# Patient Record
Sex: Female | Born: 1990 | Race: Black or African American | Hispanic: No | Marital: Single | Smoking: Never smoker
Health system: Southern US, Community
[De-identification: ages and names within clinical notes are randomized; demographics above are authoritative.]

---
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
## 2010-11-04 ENCOUNTER — Emergency Department (HOSPITAL_COMMUNITY)
Admission: EM | Admit: 2010-11-04 | Discharge: 2010-11-04 | Discharge status: Against Medical Advice | Payer: Self-pay | Attending: Emergency Medicine | Admitting: Emergency Medicine

## 2013-08-19 ENCOUNTER — Encounter (HOSPITAL_COMMUNITY): Payer: Self-pay | Admitting: Emergency Medicine

## 2013-08-19 ENCOUNTER — Emergency Department (HOSPITAL_COMMUNITY)
Admission: EM | Admit: 2013-08-19 | Discharge: 2013-08-19 | Disposition: A | Discharge status: Home / Self Care | Payer: Medicaid Other | Attending: Emergency Medicine | Admitting: Emergency Medicine

## 2013-08-19 DIAGNOSIS — H53149 Visual discomfort, unspecified: Secondary | ICD-10-CM | POA: Insufficient documentation

## 2013-08-19 DIAGNOSIS — H5710 Ocular pain, unspecified eye: Secondary | ICD-10-CM | POA: Insufficient documentation

## 2013-08-19 DIAGNOSIS — H5789 Other specified disorders of eye and adnexa: Secondary | ICD-10-CM | POA: Insufficient documentation

## 2013-08-19 MED ORDER — GENTAMICIN SULFATE 0.3 % OP SOLN: 1.0000 [drp] | OPHTHALMIC | Status: DC

## 2013-08-19 MED ORDER — FLUORESCEIN SODIUM 1 MG OP STRP
1.0000 | ORAL_STRIP | Freq: Once | OPHTHALMIC | Status: DC
  Filled 2013-08-19: qty 1

## 2013-08-19 MED ORDER — TETRACAINE HCL 0.5 % OP SOLN
1.0000 [drp] | Freq: Once | OPHTHALMIC | Status: DC
  Filled 2013-08-19: qty 2

## 2013-08-19 MED ORDER — HYDROCODONE-ACETAMINOPHEN 7.5-325 MG/15ML PO SOLN: 7.5000 mL | Freq: Four times a day (QID) | ORAL | Status: AC | PRN

## 2013-08-19 NOTE — ED Provider Notes (Signed)
CSN: [id number]     Arrival date & time 08/19/13  0803 History   First MD Initiated Contact with Patient 08/19/13 0815     Chief Complaint  Patient presents with  . Eye Pain     (Consider location/radiation/quality/duration/timing/severity/associated sxs/prior Treatment) HPI Comments:    Patient is a 23 y.o. female presenting with eye pain. The history is provided by the patient. No language interpreter was used.  Eye Pain The current episode started in the past 7 days. The problem occurs constantly. The problem has been unchanged. Pertinent negatives include no abdominal pain, anorexia, arthralgias, change in bowel habit, chest pain, chills, congestion, coughing, diaphoresis, fatigue, fever, headaches, joint swelling, myalgias, nausea, neck pain, numbness, rash, sore throat, swollen glands, urinary symptoms, vertigo, visual change, vomiting or weakness. Exacerbated by: light. Treatments tried: cols compress. The treatment provided no relief.    History reviewed. No pertinent past medical history. History reviewed. No pertinent past surgical history. History reviewed. No pertinent family history. History  Substance Use Topics  . Smoking status: Never Smoker   . Smokeless tobacco: Never Used  . Alcohol Use: No   OB History   Grav Para Term Preterm Abortions TAB SAB Ect Mult Living                 Review of Systems  Constitutional: Negative for fever, chills, diaphoresis and fatigue.  HENT: Negative for congestion and sore throat.   Eyes: Positive for photophobia, pain and redness. Negative for discharge, itching and visual disturbance.  Respiratory: Negative for cough.   Cardiovascular: Negative for chest pain.  Gastrointestinal: Negative for nausea, vomiting, abdominal pain, anorexia and change in bowel habit.  Musculoskeletal: Negative for arthralgias, joint swelling, myalgias and neck pain.  Skin: Negative for rash.  Neurological: Negative for vertigo, weakness, numbness and  headaches.      Allergies  Review of patient's allergies indicates not on file.  Home Medications   Prior to Admission medications   Not on File   BP 122/65  Pulse 76  Temp(Src) 97.8 F (36.6 C) (Oral)  Resp 12  Ht 5' 11.5" (1.816 m)  Wt 176 lb (79.833 kg)  BMI 24.21 kg/m2  SpO2 100% Physical Exam  Constitutional: She is oriented to person, place, and time. She appears well-developed and well-nourished. No distress.  HENT:  Head: Normocephalic and atraumatic.  Eyes: EOM are normal. Pupils are equal, round, and reactive to light. Lids are everted and swept, no foreign bodies found. Right eye exhibits no chemosis, no discharge, no exudate and no hordeolum. No foreign body present in the right eye. Left eye exhibits no chemosis, no discharge, no exudate and no hordeolum. No foreign body present in the left eye. Right conjunctiva is injected. Right conjunctiva has no hemorrhage. Left conjunctiva is not injected. Left conjunctiva has no hemorrhage. No scleral icterus. Right eye exhibits normal extraocular motion and no nystagmus. Left eye exhibits normal extraocular motion and no nystagmus.  Slit lamp exam:      The right eye shows no corneal abrasion, no corneal flare, no corneal ulcer, no foreign body and no fluorescein uptake.       The left eye shows no corneal abrasion, no corneal flare, no corneal ulcer and no foreign body.  Patient with right eye injection, no steamy cornea, no pain with indirect light exam.  No pain with extraocular movements.  Fluorescein examination without evidence of corneal abrasion or ulceration.   Unable to assess high pressure as tonopen is nonfunctional.  Neck: Normal range of motion.  Cardiovascular: Normal rate, regular rhythm and normal heart sounds.  Exam reveals no gallop and no friction rub.   No murmur heard. Pulmonary/Chest: Effort normal and breath sounds normal. No respiratory distress.  Abdominal: Soft. Bowel sounds are normal. She exhibits  no distension and no mass. There is no tenderness. There is no guarding.  Neurological: She is alert and oriented to person, place, and time.  Skin: Skin is warm and dry. She is not diaphoretic.    ED Course  Procedures (including critical care time) Labs Review Labs Reviewed - No data to display  Imaging Review No results found.   EKG Interpretation None      MDM   Final diagnoses:  Eye pain    Patient, with red painful eye, photophobia.  No signs of abrasion or infection, no dendritic stains on fluorescein examination, suspect keratitis of the right eye.  Will treat with Garamycin eyedrops, pain medication and ophthalmology followup within 48 hours if not improving.   Arthor Captainbigail Dilon Lank, PA-C 08/20/13 1510

## 2013-08-19 NOTE — Discharge Instructions (Signed)
Leave you contact lenses out of your eye. Use the eye drops as directed. Cold compresses for relief. Read the retun precautions carefully for reasons to come back to the emergency department.  Corneal Ulcer A corneal ulcer is an open sore on the cornea. The cornea is the clear covering at the front and center of the eye.  CAUSES  Most corneal ulcers are caused by infection, but there are other causes as well. Bacterial infection. A bacterial infection can occur and cause a corneal ulcer if: Contact lenses are worn too long (especially overnight) or are not properly cared for. An eye injury occurs, allowing bacteria to infect the area of injury. Viral infection. A viral infection can occur and cause a corneal ulcer if: The eye becomes infected with a virus, such as the herpes simplex (cold sore) virus, chickenpox virus, or shingles virus. Fungal infection. A fungal infection can occur and cause a corneal ulcer if: An eye injury resulted from contact with a plant or plant material. An anti-inflammatory eye drop is overused. You have a weakened immune system. Contact lenses are improperly cared for or become infected. Foreign bodies in the eye, such as sand, glass, or small pieces of glass or metal. Dry eyes. Certain disorders that prevent eyelids from closing completely, such as Bell's palsy. Contact lenses, especially extended-wear soft contact lenses. Contact lenses can: Scratch the cornea's surface, allowing bacteria to enter the scratch. Trap dirt underneath the contact lens, which can scratch the cornea. Harbor bacteria and fungi, making it more likely for bacterial infections to occur. Block oxygen from the cornea, making it more likely for infections to occur. SYMPTOMS  Eye pain that is often severe. Blurry vision. Light sensitivity. Pus or thick discharge coming from your eye. Eye redness. Feeling like something is in your eye. Watery or itchy eye. Burning or stinging  feeling. Some ulcers that are very big may be seen as a white spot on the cornea. DIAGNOSIS  An eye exam will be performed. Your health care provider may use a special kind of microscope (slit lamp) to look at the cornea. Eye drops may be put into the eye to make the ulcer easier to see. If it is suspected that an infection caused the corneal ulcer, tissue samples or cultures from the eye may be taken. Numbing eye drops will be given before any samples or cultures are taken. The samples or cultures will be examined in the lab to check for bacteria, viruses, or fungi. TREATMENT  Treatment of the corneal ulcer depends on the cause. If your ulcer is severe, you may be given antibiotic eye drops up until your health care provider knows the test results. Other treatments can include: Antibacterial, antiviral, or antifungal eye drops or ointment. Removing the foreign body that caused the eye injury. Artificial tears or a bandage contact lens if severe dry eyes caused the corneal ulcer. Over-the-counter or prescription pain medicine. Steroidal eye drops if the eye is inflamed and swollen. Antibiotic medicines by mouth. An injection of medicine under the thin membrane covering the eyeball (conjunctiva). This allows medicine to reach the ulcer in high doses. Eye patching to reduce irritation from blinking and bright light. An eye patch may not be given if the ulcer was caused by a bacterial infection. If the corneal ulcer causes a scar on the cornea that interferes with vision, hospitalization and surgery may be needed to replace the cornea (corneal transplant). HOME CARE INSTRUCTIONS  If prescribed, use your antibiotic pills, eye  drops, or ointment as directed. Continue using them even if you start to feel better. You may have to apply eye drops as often as every few minutes to every hour, for days. It may be necessary to set your alarm clock every few minutes to every hour during the night. This is  absolutely necessary. Only take over-the-counter or prescription medicines as directed by your health care provider. Apply artificial tears as needed if you have dry eyes. Do not touch or rub your eye, because this may increase the irritation and spread the infection. Avoid wearing eye makeup. Stay in a dark room and use sunglasses if you have light sensitivity. Apply cool packs to your eye to relieve discomfort and swelling. If your eye is patched, you should not drive or use machinery. You will have reduced side vision and ability to judge distance. Do not drive or operate machinery until approved by your health care provider. Your ability to judge distances is impaired. Follow up with your health care provider as directed. Do not wear contact lenses until your health care provider approves. If you normally wear contact lenses, follow these general rules to avoid the risk of a corneal ulcer: Do not wear contact lenses while you sleep. Wash your hands before removing contact lenses. Properly sterilize and store your contact lenses. Regularly clean your contact lens case. Do not use your saliva or tap water to clean or wet your contact lenses. Remove your contact lenses if your eye becomes irritated. You may put them back in once your eyes feel better. SEEK IMMEDIATE MEDICAL CARE IF:  You notice a change in your vision. Your pain is getting worse, not better. You have increasing discharge from the eye. MAKE SURE YOU:  Understand these instructions. Will watch your condition. Will get help right away if you are not doing well or get worse. Document Released: 05/21/2004 Document Revised: 12/14/2012 Document Reviewed: 09/13/2012 [facility] Patient Information 2014 [employer], [place].

## 2013-08-19 NOTE — ED Notes (Signed)
Pt reports the pain in RT eye started on Thursday 08-17-13 . Pt does wear contacts and has not used them since. Pt reports eye sensitive to light.

## 2013-09-05 NOTE — ED Provider Notes (Signed)
Medical screening examination/treatment/procedure(s) were performed by non-physician practitioner and as supervising physician I was immediately available for consultation/collaboration.   EKG Interpretation None        Antonisha Waskey, MD 09/05/13 1907 

## 2013-10-20 ENCOUNTER — Emergency Department (HOSPITAL_COMMUNITY)
Admission: EM | Admit: 2013-10-20 | Discharge: 2013-10-20 | Disposition: A | Discharge status: Home / Self Care | Payer: Medicaid Other | Attending: Emergency Medicine | Admitting: Emergency Medicine

## 2013-10-20 ENCOUNTER — Encounter (HOSPITAL_COMMUNITY): Payer: Self-pay | Admitting: Emergency Medicine

## 2013-10-20 DIAGNOSIS — Y929 Unspecified place or not applicable: Secondary | ICD-10-CM | POA: Diagnosis not present

## 2013-10-20 DIAGNOSIS — S058X9A Other injuries of unspecified eye and orbit, initial encounter: Secondary | ICD-10-CM | POA: Insufficient documentation

## 2013-10-20 DIAGNOSIS — H579 Unspecified disorder of eye and adnexa: Secondary | ICD-10-CM | POA: Diagnosis present

## 2013-10-20 DIAGNOSIS — X58XXXA Exposure to other specified factors, initial encounter: Secondary | ICD-10-CM | POA: Insufficient documentation

## 2013-10-20 DIAGNOSIS — Y9389 Activity, other specified: Secondary | ICD-10-CM | POA: Diagnosis not present

## 2013-10-20 DIAGNOSIS — Z23 Encounter for immunization: Secondary | ICD-10-CM | POA: Insufficient documentation

## 2013-10-20 DIAGNOSIS — S0502XA Injury of conjunctiva and corneal abrasion without foreign body, left eye, initial encounter: Secondary | ICD-10-CM

## 2013-10-20 MED ORDER — GENTAMICIN SULFATE 0.3 % OP SOLN: 1.0000 [drp] | Freq: Every day | OPHTHALMIC | Status: DC

## 2013-10-20 MED ORDER — FLUORESCEIN SODIUM 1 MG OP STRP
1.0000 | ORAL_STRIP | Freq: Once | OPHTHALMIC | Status: AC
  Administered 2013-10-20: 1 via OPHTHALMIC
  Filled 2013-10-20: qty 1

## 2013-10-20 MED ORDER — TETANUS-DIPHTH-ACELL PERTUSSIS 5-2.5-18.5 LF-MCG/0.5 IM SUSP
0.5000 mL | Freq: Once | INTRAMUSCULAR | Status: AC
  Administered 2013-10-20: 0.5 mL via INTRAMUSCULAR
  Filled 2013-10-20: qty 0.5

## 2013-10-20 MED ORDER — TETRACAINE HCL 0.5 % OP SOLN
2.0000 [drp] | Freq: Once | OPHTHALMIC | Status: AC
  Administered 2013-10-20: 2 [drp] via OPHTHALMIC
  Filled 2013-10-20: qty 2

## 2013-10-20 NOTE — ED Notes (Addendum)
Lt,. Eye is red, painful, tearing and drainage noted.  Pt. Has rt. Eye a few months ago with the same symptoms.  She is out of her eye drops.  Pt. Reports it feels like something is in her eye.  Photophobia present.  Wearing her sunglasses, decreases the disomfort

## 2013-10-20 NOTE — ED Provider Notes (Signed)
CSN: [id number]     Arrival date & time 10/20/13  1018 History   First MD Initiated Contact with Patient 10/20/13 1114    This chart was scribed for Trixie DredgeEmily West PA-C, a non-physician practitioner working with Donnetta HutchingBrian Cook, MD by Lewanda RifeAlexandra Hurtado, ED Scribe. This patient was seen in room TR05C/TR05C and the patient's care was started at 11:33 AM      Chief Complaint  Patient presents with  . Eye Pain     (Consider location/radiation/quality/duration/timing/severity/associated sxs/prior Treatment) The history is provided by the patient. No language interpreter was used.   HPI Comments: Deborah Bowers is a 23 y.o. female who presents to the Emergency Department complaining of constant moderate left eye pain.  This began 2 days ago. States she wears contacts and works at an [employer] place. Reports associated left eye itchiness, and photophobia. Reports pain is exacerbated by light. States she had similar symptoms in the right eye 2 months ago, was previously evaluated in ED and prescribed gentamycin, with great improvement. Denies trying other any alleviating factors. Denies associated fever, eye discharge, pain with EOM, known foreign body, and trauma. Tetanus is not up to date. She does wear contacts.    History reviewed. No pertinent past medical history. History reviewed. No pertinent past surgical history. No family history on file. History  Substance Use Topics  . Smoking status: Never Smoker   . Smokeless tobacco: Never Used  . Alcohol Use: No   OB History   Grav Para Term Preterm Abortions TAB SAB Ect Mult Living                 Review of Systems  Constitutional: Negative for fever and chills.  Eyes: Positive for photophobia, pain, redness and itching. Negative for discharge and visual disturbance.  Musculoskeletal: Negative for myalgias.  Skin: Negative for rash and wound.  Allergic/Immunologic: Negative for immunocompromised state.  Hematological: Does not bruise/bleed  easily.      Allergies  Review of patient's allergies indicates no known allergies.  Home Medications   Prior to Admission medications   Medication Sig Start Date End Date Taking? Authorizing Provider  gentamicin (GARAMYCIN) 0.3 % ophthalmic solution Place 1 drop into the right eye every 4 (four) hours. 08/19/13   Arthor CaptainAbigail Harris, PA-C  HYDROcodone-acetaminophen (HYCET) 7.5-325 mg/15 ml solution Take 7.5 mLs by mouth 4 (four) times daily as needed for moderate pain. 08/19/13 08/19/14  Arthor CaptainAbigail Harris, PA-C   BP 116/57  Pulse 76  Temp(Src) 99.2 F (37.3 C) (Oral)  Resp 18  Ht 4\' 11"  (1.499 m)  Wt 170 lb (77.111 kg)  BMI 34.32 kg/m2  SpO2 99% Physical Exam  Nursing note and vitals reviewed. Constitutional: She appears well-developed and well-nourished. No distress.  HENT:  Head: Normocephalic and atraumatic.  Eyes: EOM are normal. Pupils are equal, round, and reactive to light. Lids are everted and swept, no foreign bodies found. Left eye exhibits discharge (clear ). Left conjunctiva is injected. No scleral icterus.  Fundoscopic exam:      The left eye shows no exudate.  Slit lamp exam:      The left eye shows corneal abrasion and fluorescein uptake. The left eye shows no foreign body.    Left eyelid is normal.     Visual Acuity  Right Eye Distance: 20/30 Left Eye Distance: 20/200 Bilateral Distance: 20/25  Right Eye Near: R Near: 20/25 Left Eye Near:  L Near: 20/100 Bilateral Near:  20/20   Neck: Neck supple.  Pulmonary/Chest: Effort normal.  Neurological: She is alert.  Skin: She is not diaphoretic.    ED Course  Procedures (including critical care time) COORDINATION OF CARE:  Nursing notes reviewed. Vital signs reviewed. Initial pt interview and examination performed.   Filed Vitals:   10/20/13 1029  BP: 116/57  Pulse: 76  Temp: 99.2 F (37.3 C)  TempSrc: Oral  Resp: 18  Height: 4\' 11"  (1.499 m)  Weight: 170 lb (77.111 kg)  SpO2: 99%    11:33  AM-Discussed work up plan with pt at bedside, which includes  Orders Placed This Encounter  Procedures  . Visual acuity screening (if they wear contacts or glasses - do the screening with those in place if available)    Standing Status: Standing     Number of Occurrences: 1     Standing Expiration Date:   . Pt agrees with plan.   Treatment plan initiated:Medications - No data to display   Initial diagnostic testing ordered.       Labs Review Labs Reviewed - No data to display  Imaging Review No results found.   EKG Interpretation None      MDM   Final diagnoses:  Left corneal abrasion, initial encounter   Afebrile nontoxic patient with left corneal abrasion.  She wears contact lenses - advised to not wear contacts until her eye doctor clears her to do so.  No e/o cellulitis.  No apparent FB on exam.  D/C home with gentamycin drops, pt declined pain medication, ophthalmology follow up.  Discussed findings, treatment, and follow up  with patient.  Pt given return precautions.  Pt verbalizes understanding and agrees with plan.      I personally performed the services described in this documentation, which was scribed in my presence. The recorded information has been reviewed and is accurate.    Trixie Dredgemily West, PA-C 10/20/13 1303

## 2013-10-20 NOTE — Discharge Instructions (Signed)
Read the information below.  Use the prescribed medication as directed.  Please discuss all new medications with your pharmacist.  You may return to the Emergency Department at any time for worsening condition or any new symptoms that concern you.  If you develop uncontrolled pain, change in your vision, large amount of discharge, difficulty moving your eyes, see your eye doctor or return to the ER immediately for a recheck.    Corneal Abrasion The cornea is the clear covering at the front and center of the eye. When looking at the colored portion of the eye (iris), you are looking through the cornea. This very thin tissue is made up of many layers. The surface layer is a single layer of cells (corneal epithelium) and is one of the most sensitive tissues in the body. If a scratch or injury causes the corneal epithelium to come off, it is called a corneal abrasion. If the injury extends to the tissues below the epithelium, the condition is called a corneal ulcer. CAUSES   Scratches.  Trauma.  Foreign body in the eye. Some people have recurrences of abrasions in the area of the original injury even after it has healed (recurrent erosion syndrome). Recurrent erosion syndrome generally improves and goes away with time. SYMPTOMS   Eye pain.  Difficulty or inability to keep the injured eye open.  The eye becomes very sensitive to light.  Recurrent erosions tend to happen suddenly, first thing in the morning, usually after waking up and opening the eye. DIAGNOSIS  Your health care provider can diagnose a corneal abrasion during an eye exam. Dye is usually placed in the eye using a drop or a small paper strip moistened by your tears. When the eye is examined with a special light, the abrasion shows up clearly because of the dye. TREATMENT   Small abrasions may be treated with antibiotic drops or ointment alone.  A pressure patch may be put over the eye. If this is done, follow your doctor's  instructions for when to remove the patch. Do not drive or use machines while the eye patch is on. Judging distances is hard to do with a patch on. If the abrasion becomes infected and spreads to the deeper tissues of the cornea, a corneal ulcer can result. This is serious because it can cause corneal scarring. Corneal scars interfere with light passing through the cornea and cause a loss of vision in the involved eye. HOME CARE INSTRUCTIONS  Use medicine or ointment as directed. Only take over-the-counter or prescription medicines for pain, discomfort, or fever as directed by your health care provider.  Do not drive or operate machinery if your eye is patched. Your ability to judge distances is impaired.  If your health care provider has given you a follow-up appointment, it is very important to keep that appointment. Not keeping the appointment could result in a severe eye infection or permanent loss of vision. If there is any problem keeping the appointment, let your health care provider know. SEEK MEDICAL CARE IF:   You have pain, light sensitivity, and a scratchy feeling in one eye or both eyes.  Your pressure patch keeps loosening up, and you can blink your eye under the patch after treatment.  Any kind of discharge develops from the eye after treatment or if the lids stick together in the morning.  You have the same symptoms in the morning as you did with the original abrasion days, weeks, or months after the abrasion healed.  MAKE SURE YOU:   Understand these instructions.  Will watch your condition.  Will get help right away if you are not doing well or get worse. Document Released: 04/10/2000 Document Revised: 04/18/2013 Document Reviewed: 12/19/2012 [facility] Patient Information 2015 [place], [place]. This information is not intended to replace advice given to you by your health care provider. Make sure you discuss any questions you have with your health care provider.

## 2013-10-20 NOTE — ED Notes (Signed)
Pt states that her contact is her right eye pt states she is unable to put her contact in her left eye due to irritation

## 2013-10-20 NOTE — ED Notes (Signed)
Pt refused wheelchair.  

## 2013-10-29 NOTE — ED Provider Notes (Signed)
Medical screening examination/treatment/procedure(s) were performed by non-physician practitioner and as supervising physician I was immediately available for consultation/collaboration.   EKG Interpretation None       Deborah HutchingBrian Cook, MD 10/29/13 2038

## 2015-03-09 ENCOUNTER — Encounter (HOSPITAL_COMMUNITY): Payer: Self-pay

## 2015-03-09 ENCOUNTER — Emergency Department (HOSPITAL_COMMUNITY)
Admission: EM | Admit: 2015-03-09 | Discharge: 2015-03-09 | Disposition: A | Discharge status: Home / Self Care | Payer: Medicaid Other | Attending: Emergency Medicine | Admitting: Emergency Medicine

## 2015-03-09 DIAGNOSIS — R220 Localized swelling, mass and lump, head: Secondary | ICD-10-CM

## 2015-03-09 DIAGNOSIS — K002 Abnormalities of size and form of teeth: Secondary | ICD-10-CM | POA: Insufficient documentation

## 2015-03-09 DIAGNOSIS — K029 Dental caries, unspecified: Secondary | ICD-10-CM

## 2015-03-09 DIAGNOSIS — K047 Periapical abscess without sinus: Secondary | ICD-10-CM | POA: Insufficient documentation

## 2015-03-09 MED ORDER — PENICILLIN V POTASSIUM 500 MG PO TABS: 500.0000 mg | ORAL_TABLET | Freq: Four times a day (QID) | ORAL | Status: AC

## 2015-03-09 MED ORDER — TRAMADOL HCL 50 MG PO TABS
50.0000 mg | ORAL_TABLET | Freq: Four times a day (QID) | ORAL | Status: AC | PRN
Start: 2015-03-09 — End: ?

## 2015-03-09 MED ORDER — PENICILLIN V POTASSIUM 500 MG PO TABS
500.0000 mg | ORAL_TABLET | Freq: Once | ORAL | Status: AC
  Administered 2015-03-09: 500 mg via ORAL
  Filled 2015-03-09: qty 1

## 2015-03-09 NOTE — ED Provider Notes (Signed)
CSN: [id number]     Arrival date & time 03/09/15  1740 History  By signing my name below, I, Phillis HaggisGabriella Gaje, attest that this documentation has been prepared under the direction and in the presence of Danelle BerryLeisa Brandis Wixted, PA-C. Electronically Signed: Phillis HaggisGabriella Gaje, ED Scribe. 03/09/2015. 8:13 PM.   Chief Complaint  Patient presents with  . Oral Swelling   The history is provided by the patient. No language interpreter was used.  HPI Comments: Deborah Bowers is a 24 y.o. female who presents to the Emergency Department complaining of intermittent, throbbing left lower dental pain with facial swelling onset 1 day ago. Pt reports only brushing the area with her toothbrush and Pronamel toothpaste to some relief over the past 3 weeks, but states that the pain worsened last night. She denies trying anything for her pain last night. She denies anti-biotic treatment for this problem. She states that she has seen a dentist for this problem. She denies fever, chills, nausea, or vomiting.   History reviewed. No pertinent past medical history. No past surgical history on file. No family history on file. Social History  Substance Use Topics  . Smoking status: Never Smoker   . Smokeless tobacco: Never Used  . Alcohol Use: No   OB History    No data available     Review of Systems  Constitutional: Negative for fever and chills.  HENT: Positive for dental problem and facial swelling.   Gastrointestinal: Negative for nausea and vomiting.  All other systems reviewed and are negative.  Allergies  Review of patient's allergies indicates no known allergies.  Home Medications   Prior to Admission medications   Medication Sig Start Date End Date Taking? Authorizing Provider  penicillin v potassium (VEETID) 500 MG tablet Take 1 tablet (500 mg total) by mouth 4 (four) times daily. 03/09/15 03/16/15  Danelle BerryLeisa Deborrah Mabin, PA-C  traMADol (ULTRAM) 50 MG tablet Take 1 tablet (50 mg total) by mouth every 6 (six) hours  as needed. 03/09/15   Danelle BerryLeisa Gene Glazebrook, PA-C   BP 124/54 mmHg  Pulse 71  Temp(Src) 98.3 F (36.8 C) (Oral)  Resp 14  SpO2 100% Physical Exam  Constitutional: She is oriented to person, place, and time. She appears well-developed and well-nourished. No distress.  HENT:  Head: Normocephalic and atraumatic.    Right Ear: External ear normal.  Left Ear: External ear normal.  Nose: Nose normal.  Mouth/Throat: Uvula is midline, oropharynx is clear and moist and mucous membranes are normal. Mucous membranes are not pale, not dry and not cyanotic. No trismus in the jaw. Abnormal dentition. Dental caries present. No dental abscesses or uvula swelling. No oropharyngeal exudate, posterior oropharyngeal edema, posterior oropharyngeal erythema or tonsillar abscesses.  No periapical abscess noted, Vast dental decay with multiple caries and filling No submandibular edema, no tenderness to palpation sublingually  Eyes: Conjunctivae and EOM are normal. Pupils are equal, round, and reactive to light. Right eye exhibits no discharge. Left eye exhibits no discharge. No scleral icterus.  Neck: Normal range of motion. Neck supple. No JVD present. No tracheal deviation present.  Cardiovascular: Normal rate and regular rhythm.   Pulmonary/Chest: Effort normal and breath sounds normal. No stridor. No respiratory distress.  Musculoskeletal: Normal range of motion. She exhibits no edema.  Lymphadenopathy:    She has no cervical adenopathy.  Neurological: She is alert and oriented to person, place, and time. She exhibits normal muscle tone. Coordination normal.  Skin: Skin is warm and dry. No rash noted. She  is not diaphoretic. No erythema. No pallor.  Psychiatric: She has a normal mood and affect. Her behavior is normal. Judgment and thought content normal.    ED Course  Procedures (including critical care time) DIAGNOSTIC STUDIES: Oxygen Saturation is 100% on RA, normal by my interpretation.    COORDINATION  OF CARE: 8:11 PM-Discussed treatment plan which includes penicillin and follow up with dentist with pt at bedside and pt agreed to plan.   Labs Review Labs Reviewed - No data to display  Imaging Review No results found. I have personally reviewed and evaluated these images and lab results as part of my medical decision-making.   EKG Interpretation None      MDM   Final diagnoses:  Dental infection  Pain due to dental caries  Cheek swelling   Patient with toothache.  No gross abscess.  Exam unconcerning for Ludwig's angina or spread of infection.  Will treat with penicillin and pain medicine.  Urged patient to follow-up with dentist.    I personally performed the services described in this documentation, which was scribed in my presence. The recorded information has been reviewed and is accurate.      Danelle Berry, PA-C 03/12/15 0028  Melene Plan, DO 03/12/15 [phone]

## 2015-03-09 NOTE — Discharge Instructions (Signed)
Dental Caries °Dental caries (also called tooth decay) is the most common oral disease. It can occur at any age but is more common in children and young adults.  °HOW DENTAL CARIES DEVELOPS  °The process of decay begins when bacteria and foods (particularly sugars and starches) combine in your mouth to produce plaque. Plaque is a substance that sticks to the hard, outer surface of a tooth (enamel). The bacteria in plaque produce acids that attack enamel. These acids may also attack the root surface of a tooth (cementum) if it is exposed. Repeated attacks dissolve these surfaces and create holes in the tooth (cavities). If left untreated, the acids destroy the other layers of the tooth.  °RISK FACTORS °· Frequent sipping of sugary beverages.   °· Frequent snacking on sugary and starchy foods, especially those that easily get stuck in the teeth.   °· Poor oral hygiene.   °· Dry mouth.   °· Substance abuse such as methamphetamine abuse.   °· Broken or poor-fitting dental restorations.   °· Eating disorders.   °· Gastroesophageal reflux disease (GERD).   °· Certain radiation treatments to the head and neck. °SYMPTOMS °In the early stages of dental caries, symptoms are seldom present. Sometimes white, chalky areas may be seen on the enamel or other tooth layers. In later stages, symptoms may include: °· Pits and holes on the enamel. °· Toothache after sweet, hot, or cold foods or drinks are consumed. °· Pain around the tooth. °· Swelling around the tooth. °DIAGNOSIS  °Most of the time, dental caries is detected during a regular dental checkup. A diagnosis is made after a thorough medical and dental history is taken and the surfaces of your teeth are checked for signs of dental caries. Sometimes special instruments, such as lasers, are used to check for dental caries. Dental X-ray exams may be taken so that areas not visible to the eye (such as between the contact areas of the teeth) can be checked for cavities.    °TREATMENT  °If dental caries is in its early stages, it may be reversed with a fluoride treatment or an application of a remineralizing agent at the dental office. Thorough brushing and flossing at home is needed to aid these treatments. If it is in its later stages, treatment depends on the location and extent of tooth destruction:  °· If a small area of the tooth has been destroyed, the destroyed area will be removed and cavities will be filled with a material such as gold, silver amalgam, or composite resin.   °· If a large area of the tooth has been destroyed, the destroyed area will be removed and a cap (crown) will be fitted over the remaining tooth structure.   °· If the center part of the tooth (pulp) is affected, a procedure called a root canal will be needed before a filling or crown can be placed.   °· If most of the tooth has been destroyed, the tooth may need to be pulled (extracted). °HOME CARE INSTRUCTIONS °You can prevent, stop, or reverse dental caries at home by practicing good oral hygiene. Good oral hygiene includes: °· Thoroughly cleaning your teeth at least twice a day with a toothbrush and dental floss.   °· Using a fluoride toothpaste. A fluoride mouth rinse may also be used if recommended by your dentist or health care provider.   °· Restricting the amount of sugary and starchy foods and sugary liquids you consume.   °· Avoiding frequent snacking on these foods and sipping of these liquids.   °· Keeping regular visits with   a dentist for checkups and cleanings. °PREVENTION  °· Practice good oral hygiene. °· Consider a dental sealant. A dental sealant is a coating material that is applied by your dentist to the pits and grooves of teeth. The sealant prevents food from being trapped in them. It may protect the teeth for several years. °· Ask about fluoride supplements if you live in a community without fluorinated water or with water that has a low fluoride content. Use fluoride supplements  as directed by your dentist or health care provider. °· Allow fluoride varnish applications to teeth if directed by your dentist or health care provider. °  °This information is not intended to replace advice given to you by your health care provider. Make sure you discuss any questions you have with your health care provider. °  °Document Released: 01/03/2002 Document Revised: 05/04/2014 Document Reviewed: 04/15/2012 °Elsevier Interactive Patient Education ©2016 [employer] ° °Dental Pain °Dental pain may be caused by many things, including: °· Tooth decay (cavities or caries). Cavities expose the nerve of your tooth to air and hot or cold temperatures. This can cause pain or discomfort. °· Abscess or infection. A dental abscess is a collection of infected pus from a bacterial infection in the inner part of the tooth (pulp). It usually occurs at the end of the tooth's root. °· Injury. °· An unknown reason (idiopathic). °Your pain may be mild or severe. It may only occur when: °· You are chewing. °· You are exposed to hot or cold temperature. °· You are eating or drinking sugary foods or beverages, such as soda or candy. °Your pain may also be constant. °HOME CARE INSTRUCTIONS °Watch your dental pain for any changes. The following actions may help to lessen any discomfort that you are feeling: °· Take medicines only as directed by your dentist. °· If you were prescribed an antibiotic medicine, finish all of it even if you start to feel better. °· Keep all follow-up visits as directed by your dentist. This is important. °· Do not apply heat to the outside of your face. °· Rinse your mouth or gargle with salt water if directed by your dentist. This helps with pain and swelling. °¨ You can make salt water by adding ¼ tsp of salt to 1 cup of warm water. °· Apply ice to the painful area of your face: °¨ Put ice in a plastic bag. °¨ Place a towel between your skin and the bag. °¨ Leave the ice on for 20 minutes, 2-3  times per day. °· Avoid foods or drinks that cause you pain, such as: °¨ Very hot or very cold foods or drinks. °¨ Sweet or sugary foods or drinks. °SEEK MEDICAL CARE IF: °· Your pain is not controlled with medicines. °· Your symptoms are worse. °· You have new symptoms. °SEEK IMMEDIATE MEDICAL CARE IF: °· You are unable to open your mouth. °· You are having trouble breathing or swallowing. °· You have a fever. °· Your face, neck, or jaw is swollen. °  °This information is not intended to replace advice given to you by your health care provider. Make sure you discuss any questions you have with your health care provider. °  °Document Released: 04/13/2005 Document Revised: 08/28/2014 Document Reviewed: 04/09/2014 °Elsevier Interactive Patient Education ©2016 [employer] Inc[employer] ° °

## 2015-03-09 NOTE — ED Notes (Signed)
She c/o left lower toothache with some facial swelling x 2-3 days.  She is in no distress.

## 2015-03-09 NOTE — ED Notes (Signed)
No drainage noted by the patient. She has only brushed after each meal, but nothing for the discomfort.

## 2015-03-12 ENCOUNTER — Encounter (HOSPITAL_COMMUNITY): Payer: Self-pay | Admitting: Emergency Medicine

## 2015-03-12 ENCOUNTER — Emergency Department (HOSPITAL_COMMUNITY)
Admission: EM | Admit: 2015-03-12 | Discharge: 2015-03-12 | Disposition: A | Discharge status: Home / Self Care | Payer: Medicaid Other | Attending: Emergency Medicine | Admitting: Emergency Medicine

## 2015-03-12 DIAGNOSIS — Y9289 Other specified places as the place of occurrence of the external cause: Secondary | ICD-10-CM | POA: Insufficient documentation

## 2015-03-12 DIAGNOSIS — X58XXXA Exposure to other specified factors, initial encounter: Secondary | ICD-10-CM | POA: Insufficient documentation

## 2015-03-12 DIAGNOSIS — Z792 Long term (current) use of antibiotics: Secondary | ICD-10-CM | POA: Insufficient documentation

## 2015-03-12 DIAGNOSIS — S0502XA Injury of conjunctiva and corneal abrasion without foreign body, left eye, initial encounter: Secondary | ICD-10-CM | POA: Insufficient documentation

## 2015-03-12 DIAGNOSIS — Y999 Unspecified external cause status: Secondary | ICD-10-CM | POA: Insufficient documentation

## 2015-03-12 DIAGNOSIS — H16001 Unspecified corneal ulcer, right eye: Secondary | ICD-10-CM | POA: Insufficient documentation

## 2015-03-12 DIAGNOSIS — Y9389 Activity, other specified: Secondary | ICD-10-CM | POA: Insufficient documentation

## 2015-03-12 MED ORDER — FLUORESCEIN SODIUM 1 MG OP STRP
1.0000 | ORAL_STRIP | Freq: Once | OPHTHALMIC | Status: AC
  Administered 2015-03-12: 1 via OPHTHALMIC
  Filled 2015-03-12: qty 1

## 2015-03-12 MED ORDER — HYDROCODONE-ACETAMINOPHEN 5-325 MG PO TABS: 1.0000 | ORAL_TABLET | Freq: Four times a day (QID) | ORAL | Status: AC | PRN

## 2015-03-12 MED ORDER — TETRACAINE HCL 0.5 % OP SOLN
2.0000 [drp] | Freq: Once | OPHTHALMIC | Status: AC
  Administered 2015-03-12: 2 [drp] via OPHTHALMIC
  Filled 2015-03-12: qty 2

## 2015-03-12 MED ORDER — MOXIFLOXACIN HCL 0.5 % OP SOLN: 1.0000 [drp] | Freq: Three times a day (TID) | OPHTHALMIC | Status: DC

## 2015-03-12 NOTE — Discharge Instructions (Signed)
Call the Dr. provided for an appointment is for tomorrow.  Return here as needed

## 2015-03-12 NOTE — ED Provider Notes (Signed)
CSN: [id number]     Arrival date & time 03/12/15  1431 History  By signing my name below, I, Deborah Bowers, attest that this documentation has been prepared under the direction and in the presence of Charlestine Night, PA-C Electronically Signed: Jarvis Bowers, ED Scribe. 03/12/2015. 2:56 PM.    Chief Complaint  Patient presents with  . Eye Drainage   The history is provided by the patient. No language interpreter was used.    HPI Comments: Deborah Bowers is a 24 y.o. female who presents to the Emergency Department complaining of constant, moderate, left eye pain. She reports associated redness and clear drainage from the eye. She states she woke up with a crust along her eye. Pt notes the pain is exacerbated with light and becomes throbbing in quality. Pt endorses that she wears contacts but has not been wearing it today. She denies any sick contacts. She reports no issues with her right eye at this time. Pt denies any foreign body sensation. She denies any other complaints or symptoms.  History reviewed. No pertinent past medical history. History reviewed. No pertinent past surgical history. History reviewed. No pertinent family history. Social History  Substance Use Topics  . Smoking status: Never Smoker   . Smokeless tobacco: Never Used  . Alcohol Use: No   OB History    No data available     Review of Systems All other systems negative except as documented in the HPI. All pertinent positives and negatives as reviewed in the HPI.   Allergies  Review of patient's allergies indicates no known allergies.  Home Medications   Prior to Admission medications   Medication Sig Start Date End Date Taking? Authorizing Provider  penicillin v potassium (VEETID) 500 MG tablet Take 1 tablet (500 mg total) by mouth 4 (four) times daily. 03/09/15 03/16/15  Danelle Berry, PA-C  traMADol (ULTRAM) 50 MG tablet Take 1 tablet (50 mg total) by mouth every 6 (six) hours as needed. 03/09/15    Danelle Berry, PA-C   Triage Vitals: BP 135/68 mmHg  Pulse 88  Temp(Src) 98.3 F (36.8 C) (Oral)  Resp 18  Ht  (1.499 m)  Wt 167 lb (75.751 kg)  BMI 33.71 kg/m2  SpO2 98%  LMP 03/12/2015  Physical Exam  Constitutional: She is oriented to person, place, and time. She appears well-developed and well-nourished. No distress.  HENT:  Head: Normocephalic and atraumatic.  Eyes: EOM are normal. Pupils are equal, round, and reactive to light. Lids are everted and swept, no foreign bodies found. Right conjunctiva is injected.  Slit lamp exam:      The right eye shows corneal abrasion and corneal ulcer. The right eye shows no foreign body.       The left eye shows fluorescein uptake.    Neck: Neck supple. No tracheal deviation present.  Cardiovascular: Normal rate.   Pulmonary/Chest: Effort normal. No respiratory distress.  Musculoskeletal: Normal range of motion.  Neurological: She is alert and oriented to person, place, and time.  Skin: Skin is warm and dry.  Psychiatric: She has a normal mood and affect. Her behavior is normal.  Nursing note and vitals reviewed.   ED Course  Procedures (including critical care time)  DIAGNOSTIC STUDIES: Oxygen Saturation is 98% on RA, normal by my interpretation.    COORDINATION OF CARE:    Labs Review Labs Reviewed - No data to display  Imaging Review No results found. I have personally reviewed and evaluated these images and  lab results as part of my medical decision-making.  I personally performed the services described in this documentation, which was scribed in my presence. The recorded information has been reviewed and is accurate.   Patient will be referred to ophthalmology.  Told to return here as needed  Charlestine NightChristopher Pravin Perezperez, PA-C 03/12/15 1558  Arby BarretteMarcy Pfeiffer, MD 03/27/15 [phone]

## 2015-03-12 NOTE — ED Notes (Signed)
Pt reports she woke up with redness, pain, crust, and clear drainage to left eye.

## 2022-02-09 ENCOUNTER — Encounter (HOSPITAL_COMMUNITY): Payer: Self-pay | Admitting: Emergency Medicine

## 2022-02-09 ENCOUNTER — Emergency Department (HOSPITAL_COMMUNITY)
Admission: EM | Admit: 2022-02-09 | Discharge: 2022-02-09 | Disposition: A | Discharge status: Home / Self Care | Payer: Self-pay | Attending: Emergency Medicine | Admitting: Emergency Medicine

## 2022-02-09 ENCOUNTER — Other Ambulatory Visit: Payer: Self-pay

## 2022-02-09 DIAGNOSIS — J029 Acute pharyngitis, unspecified: Secondary | ICD-10-CM

## 2022-02-09 DIAGNOSIS — T7840XA Allergy, unspecified, initial encounter: Secondary | ICD-10-CM

## 2022-02-09 MED ORDER — DIPHENHYDRAMINE HCL 25 MG PO CAPS
50.0000 mg | ORAL_CAPSULE | Freq: Once | ORAL | Status: AC
  Administered 2022-02-09: 50 mg via ORAL
  Filled 2022-02-09: qty 2

## 2022-02-09 MED ORDER — FAMOTIDINE 20 MG PO TABS
20.0000 mg | ORAL_TABLET | Freq: Once | ORAL | Status: AC
  Administered 2022-02-09: 20 mg via ORAL
  Filled 2022-02-09: qty 1

## 2022-02-09 MED ORDER — PREDNISONE 20 MG PO TABS
60.0000 mg | ORAL_TABLET | Freq: Once | ORAL | Status: AC
  Administered 2022-02-09: 60 mg via ORAL
  Filled 2022-02-09: qty 3

## 2022-02-09 NOTE — ED Notes (Signed)
Discharge instructions reviewed with patient. Patient denies any questions or concerns. Patient ambulatory to lobby.

## 2022-02-09 NOTE — ED Provider Notes (Signed)
[facility] EMERGENCY DEPARTMENT Provider Note   CSN: [id number] Arrival date & time: 02/09/22  0135     History  No chief complaint on file.   Deborah Bowers is a 31 y.o. female.  Patient is a 31 year old female with no significant past medical history presenting to the emergency department with a sore throat.  Patient reports that last night she was eating some pumpkin seeds when she started develop some pain and itching in her throat.  She states that her throat felt swollen and she had some mild shortness of breath.  She denies any rash or itching, nausea, vomiting or diarrhea.  She states that she felt like she was having an allergic reaction and reports she has never had allergic reactions in the past.  She denies any recent fevers or chills, cough, congestion or runny nose.  She denies any known sick contacts.  She denies a feeling of foreign body sensation or like the seeds are still stuck in her throat.  She states that she has been able to swallow normally since she has been here.  The history is provided by the patient.       Home Medications Prior to Admission medications   Medication Sig Start Date End Date Taking? Authorizing Provider  HYDROcodone-acetaminophen (NORCO/VICODIN) 5-325 MG tablet Take 1 tablet by mouth every 6 (six) hours as needed for moderate pain. 03/12/15   Lawyer, Harrell Gave, PA-C  moxifloxacin (VIGAMOX) 0.5 % ophthalmic solution Place 1 drop into the left eye 3 (three) times daily. 03/12/15   Lawyer, Harrell Gave, PA-C  naphazoline-pheniramine (NAPHCON-A) 0.025-0.3 % ophthalmic solution Place 1 drop into both eyes daily as needed for irritation.    [provider]  traMADol (ULTRAM) 50 MG tablet Take 1 tablet (50 mg total) by mouth every 6 (six) hours as needed. Patient not taking: Reported on 03/12/2015 03/09/15   Delsa Grana, PA-C      Allergies    Patient has no known allergies.    Review of Systems   Review of  Systems  Physical Exam Updated Vital Signs BP 119/63 (BP Location: Right Arm)   Pulse 86   Temp 98.1 F (36.7 C) (Oral)   Resp 20   SpO2 100%  Physical Exam Vitals and nursing note reviewed.  Constitutional:      General: She is not in acute distress.    Appearance: Normal appearance.  HENT:     Head: Normocephalic and atraumatic.     Nose: Nose normal.     Mouth/Throat:     Mouth: Mucous membranes are moist.     Pharynx: Oropharynx is clear. No oropharyngeal exudate or posterior oropharyngeal erythema.     Comments: No swelling to posterior oropharynx, no uvula swelling, normal phonation, no trismus Eyes:     Extraocular Movements: Extraocular movements intact.     Conjunctiva/sclera: Conjunctivae normal.  Cardiovascular:     Rate and Rhythm: Normal rate and regular rhythm.     Heart sounds: Normal heart sounds.  Pulmonary:     Effort: Pulmonary effort is normal.     Breath sounds: Normal breath sounds. No wheezing.  Abdominal:     General: Abdomen is flat.     Palpations: Abdomen is soft.     Tenderness: There is no abdominal tenderness.  Musculoskeletal:        General: Normal range of motion.     Cervical back: Normal range of motion and neck supple.  Skin:    General: Skin  is warm and dry.     Findings: No rash.  Neurological:     General: No focal deficit present.     Mental Status: She is alert and oriented to person, place, and time.  Psychiatric:        Mood and Affect: Mood normal.        Behavior: Behavior normal.     ED Results / Procedures / Treatments   Labs (all labs ordered are listed, but only abnormal results are displayed) Labs Reviewed - No data to display  EKG None  Radiology No results found.  Procedures Procedures    Medications Ordered in ED Medications  diphenhydrAMINE (BENADRYL) capsule 50 mg (50 mg Oral Given 02/09/22 0233)  famotidine (PEPCID) tablet 20 mg (20 mg Oral Given 02/09/22 0232)  predniSONE (DELTASONE) tablet  60 mg (60 mg Oral Given 02/09/22 0235)    ED Course/ Medical Decision Making/ A&P                           Medical Decision Making This patient presents to the ED with chief complaint(s) of throat pain with no pertinent past medical history which further complicates the presenting complaint. The complaint involves an extensive differential diagnosis and also carries with it a high risk of complications and morbidity.    The differential diagnosis includes allergic reaction, foreign body, pharyngitis, no evidence of RPA, PTA or Ludwick's on exam, no tonsillar swelling or exudates making tonsillitis unlikely  Additional history obtained: Additional history obtained from N/A Records reviewed N/A  ED Course and Reassessment: Patient was initially evaluated by provider in triage.  She had no wheezing, rash or swelling to the oropharynx on arrival but concern for possible throat itching from allergic reaction and was given Pepcid, Benadryl and prednisone.  The patient reports improvement of her symptoms though still reports a mild sore throat.  She is hemodynamically stable with no wheezing, no urticaria and no swelling to the oropharynx with normal phonation making anaphylaxis unlikely and possible mild allergic reaction.  She denies any foreign body sensation and has been able to swallow since being here making foreign body unlikely.  The patient has no evidence of infection on her exam.  She is stable for discharge home with primary care follow-up.  She was recommended to continue Pepcid and Benadryl as needed for itching.  She was given strict return precautions.  Independent labs interpretation:  N/A  Independent visualization of imaging: N/A  Consultation: - Consulted or discussed management/test interpretation w/ external professional: N/A  Consideration for admission or further workup: Patient has no emergent conditions requiring admission at this time and is stable for discharge with  primary care follow-up Social Determinants of health: N/A            Final Clinical Impression(s) / ED Diagnoses Final diagnoses:  None    Rx / DC Orders ED Discharge Orders     None         Rexford Maus, DO 02/09/22 1142

## 2022-02-09 NOTE — ED Triage Notes (Signed)
Pt states that after eating some pumpkin seeds she started "feeling funny (sore)..... my throat started to get sore, I have a lump in my throat."   Pt reports no swelling in mouth, lips or tongue.  She does not appear to be in respiratory distress and is able to speak in complete sentences.   No swelling or itching of throat.

## 2022-02-09 NOTE — ED Provider Triage Note (Signed)
Emergency Medicine Provider Triage Evaluation Note  Deborah Bowers , a 31 y.o. female  was evaluated in triage.  Pt complains of itching sensation in the throat progressed to sore throat that started shortly after consuming seeds from a white pumpkin this evening.  States that she has had [place] in the past without difficulty but never from a white bump until tonight.  Denies any nausea vomiting shortness of breath but does endorse some soreness in the throat to the touch as well as soreness with the following.  No change in phonation and no difficulty swallowing.  No medications administered at home prior to arrival..  Review of Systems  Positive: As above Negative: As above  Physical Exam  BP (!) 140/68 (BP Location: Right Arm)   Pulse 79   Temp 98.9 F (37.2 C) (Oral)   Resp 17   SpO2 100%  Gen:   Awake, no distress   Resp:  Normal effort  MSK:   Moves extremities without difficulty  Other:  Upper airway patent on oropharyngeal exam.  No sublingual or submental tenderness palpation.  Mild anterior tracheal tenderness palpation without edema or deviation.  No lymphadenopathy on exam.  No perioral or lingual edema.  No uvular deviation or evidence of oropharyngeal abscess  Medical Decision Making  Medically screening exam initiated at 2:28 AM.  Appropriate orders placed.  Deborah Bowers was informed that the remainder of the evaluation will be completed by another provider, this initial triage assessment does not replace that evaluation, and the importance of remaining in the ED until their evaluation is complete.  DDx allergic reaction versus bacterial/viral pharyngitis.  Allergic reaction more imminently dangerous for the patient therefore will proceed with treatment for at this potential etiology.  Patient well-appearing. This chart was dictated using voice recognition software, Dragon. Despite the best efforts of this provider to proofread and correct errors, errors  may still occur which can change documentation meaning.    Emeline Darling, PA-C 02/09/22 0231

## 2022-02-09 NOTE — Discharge Instructions (Signed)
You were seen in the emergency department for your throat pain and itching.  You had no signs of severe allergic reaction but may have had a mild allergic reaction.  You can continue to take Benadryl and Pepcid as needed for itching.  You can take Tylenol or Motrin as needed for pain.  You had no signs of any throat infection on your exam but it is possible that you may have an early throat infection that is causing the pain as well.  You should follow-up with your primary doctor in the next few days to have your symptoms rechecked.  You should return to the emergency department if you have significantly worsening shortness of breath, worsening swelling of your throat or tongue, you are unable to open your mouth, you are unable to swallow your saliva, or if you have any other new or concerning symptoms.

## 2023-04-20 ENCOUNTER — Encounter (HOSPITAL_COMMUNITY): Payer: Self-pay

## 2023-04-20 ENCOUNTER — Other Ambulatory Visit: Payer: Self-pay

## 2023-04-20 ENCOUNTER — Emergency Department (HOSPITAL_COMMUNITY)
Admission: EM | Admit: 2023-04-20 | Discharge: 2023-04-20 | Disposition: A | Discharge status: Home / Self Care | Payer: Self-pay | Attending: Emergency Medicine | Admitting: Emergency Medicine

## 2023-04-20 DIAGNOSIS — S0501XA Injury of conjunctiva and corneal abrasion without foreign body, right eye, initial encounter: Secondary | ICD-10-CM | POA: Insufficient documentation

## 2023-04-20 DIAGNOSIS — X58XXXA Exposure to other specified factors, initial encounter: Secondary | ICD-10-CM | POA: Insufficient documentation

## 2023-04-20 MED ORDER — MOXIFLOXACIN HCL 0.5 % OP SOLN: 1.0000 [drp] | Freq: Three times a day (TID) | OPHTHALMIC | 0 refills | Status: AC

## 2023-04-20 MED ORDER — FLUORESCEIN SODIUM 1 MG OP STRP
1.0000 | ORAL_STRIP | Freq: Once | OPHTHALMIC | Status: AC
  Administered 2023-04-20: 1 via OPHTHALMIC
  Filled 2023-04-20: qty 1

## 2023-04-20 MED ORDER — TETRACAINE HCL 0.5 % OP SOLN
2.0000 [drp] | Freq: Once | OPHTHALMIC | Status: AC
  Administered 2023-04-20: 2 [drp] via OPHTHALMIC
  Filled 2023-04-20: qty 4

## 2023-04-20 NOTE — ED Provider Notes (Signed)
[facility] EMERGENCY DEPARTMENT AT [facility] Provider Note   CSN: [id number] Arrival date & time: 04/20/23  0008     History  Chief Complaint  Patient presents with   Eye Pain    Deborah Bowers is a 32 y.o. female with no significant past medical history who presents the emergency department complaining of right eye pain.  Patient states that started after she removed her contact earlier this evening.  Eyes noted to be red.   Eye Pain       Home Medications Prior to Admission medications   Medication Sig Start Date End Date Taking? Authorizing Provider  moxifloxacin (VIGAMOX) 0.5 % ophthalmic solution Place 1 drop into the right eye 3 (three) times daily. 04/20/23  Yes Seriyah Collison T, PA-C  HYDROcodone-acetaminophen (NORCO/VICODIN) 5-325 MG tablet Take 1 tablet by mouth every 6 (six) hours as needed for moderate pain. 03/12/15   Lawyer, Cristal Deer, PA-C  naphazoline-pheniramine (NAPHCON-A) 0.025-0.3 % ophthalmic solution Place 1 drop into both eyes daily as needed for irritation.    [provider]  traMADol (ULTRAM) 50 MG tablet Take 1 tablet (50 mg total) by mouth every 6 (six) hours as needed. Patient not taking: Reported on 03/12/2015 03/09/15   Danelle Berry, PA-C      Allergies    Patient has no known allergies.    Review of Systems   Review of Systems  Eyes:  Positive for pain.  All other systems reviewed and are negative.   Physical Exam Updated Vital Signs BP 128/72   Pulse 80   Temp 98 F (36.7 C)   Resp 18   Ht 5' (1.524 m)   Wt 93.4 kg   LMP 04/17/2023 (Exact Date)   SpO2 99%   BMI 40.23 kg/m  Physical Exam Vitals and nursing note reviewed.  Constitutional:      Appearance: Normal appearance.  HENT:     Head: Normocephalic and atraumatic.  Eyes:     General: Lids are normal.     Extraocular Movements: Extraocular movements intact.     Conjunctiva/sclera:     Right eye: Right conjunctiva is injected. Chemosis  present. No exudate or hemorrhage.    Pupils: Pupils are equal, round, and reactive to light.     Right eye: No fluorescein uptake.  Pulmonary:     Effort: Pulmonary effort is normal. No respiratory distress.  Skin:    General: Skin is warm and dry.  Neurological:     Mental Status: She is alert.  Psychiatric:        Mood and Affect: Mood normal.        Behavior: Behavior normal.     ED Results / Procedures / Treatments   Labs (all labs ordered are listed, but only abnormal results are displayed) Labs Reviewed - No data to display  EKG None  Radiology No results found.  Procedures Procedures    Medications Ordered in ED Medications  tetracaine (PONTOCAINE) 0.5 % ophthalmic solution 2 drop (2 drops Right Eye Given 04/20/23 0235)  fluorescein ophthalmic strip 1 strip (1 strip Right Eye Given 04/20/23 0235)    ED Course/ Medical Decision Making/ A&P                                 Medical Decision Making Risk Prescription drug management.   This patient is a 32 y.o. female  who presents to the ED for concern  of eye pain.   Differential diagnoses prior to evaluation: The emergent differential diagnosis includes, but is not limited to,  Ocular ischemia, optic neuritis, temporal arteritis, sinusitis, neuralgia, migraine, acute angle closure glaucoma, eye trauma, uveitis, iritis, corneal abrasion/ulceration. This is not an exhaustive differential.   Past Medical History / Co-morbidities / Social History: History reviewed. No pertinent past medical history.  Physical Exam: Physical exam performed. The pertinent findings include: Normal vital sign, no acute distress.  Right conjunctiva injected with some chemosis, no fluorescein uptake on exam.  Medications: I ordered medication including tetracaine and fluorescein.  I have reviewed the patients home medicines and have made adjustments as needed.   Disposition: After consideration of the diagnostic results and the  patients response to treatment, I feel that emergency department workup does not suggest an emergent condition requiring admission or immediate intervention beyond what has been performed at this time. The plan is: Discharged to home with treatment of likely conjunctival abrasion.  No uptake on exam to suggest corneal abrasion or ulceration.  However patient does wear contacts, so will prescribe antibiotics that would cover possible Pseudomonas. The patient is safe for discharge and has been instructed to return immediately for worsening symptoms, change in symptoms or any other concerns.  Final Clinical Impression(s) / ED Diagnoses Final diagnoses:  Abrasion of right conjunctiva, initial encounter    Rx / DC Orders ED Discharge Orders          Ordered    moxifloxacin (VIGAMOX) 0.5 % ophthalmic solution  3 times daily        04/20/23 0319           Portions of this report may have been transcribed using voice recognition software. Every effort was made to ensure accuracy; however, inadvertent computerized transcription errors may be present.    Jeanella Flattery 04/20/23 0353    Gilda Crease, MD 04/20/23 [id number]

## 2023-04-20 NOTE — Discharge Instructions (Addendum)
You were seen in the ER for eye pain.  Using the eye drops and light I did not see evidence of a corneal abrasion, but I anticipate you have a conjunctival abrasion. I am prescribing antibiotics to prevent any infection, and you can take ibuprofen or tylenol as needed for pain.

## 2023-04-20 NOTE — ED Triage Notes (Signed)
Pt arrived from home via POV c/o right eye pain that began this evening after pt removed her contact 5/10. Right eye is noted to be red and edematous.
